# Patient Record
Sex: Male | Born: 1975
Health system: Southern US, Community
[De-identification: ages and names within clinical notes are randomized; demographics above are authoritative.]

## PROBLEM LIST (undated history)

## (undated) DIAGNOSIS — T7840XA Allergy, unspecified, initial encounter: Secondary | ICD-10-CM

## (undated) HISTORY — DX: Allergy, unspecified, initial encounter: T78.40XA

## (undated) HISTORY — PX: BRAIN SURGERY: SHX531

---

## 2015-01-15 ENCOUNTER — Ambulatory Visit
Admission: RE | Admit: 2015-01-15 | Discharge: 2015-01-15 | Disposition: A | Discharge status: Home / Self Care | Payer: BLUE CROSS/BLUE SHIELD | Source: Ambulatory Visit | Attending: Family Medicine | Admitting: Family Medicine

## 2015-01-15 ENCOUNTER — Ambulatory Visit: Payer: BLUE CROSS/BLUE SHIELD | Admitting: Family Medicine

## 2015-01-15 ENCOUNTER — Ambulatory Visit (INDEPENDENT_AMBULATORY_CARE_PROVIDER_SITE_OTHER): Payer: BLUE CROSS/BLUE SHIELD | Admitting: Family Medicine

## 2015-01-15 ENCOUNTER — Encounter: Payer: Self-pay | Admitting: Family Medicine

## 2015-01-15 VITALS — BP 120/70 | HR 89 | Temp 97.8°F | Resp 18 | Ht 72.0 in | Wt 190.0 lb

## 2015-01-15 DIAGNOSIS — J40 Bronchitis, not specified as acute or chronic: Secondary | ICD-10-CM | POA: Diagnosis not present

## 2015-01-15 MED ORDER — ALBUTEROL SULFATE HFA 108 (90 BASE) MCG/ACT IN AERS: 2.0000 | INHALATION_SPRAY | Freq: Four times a day (QID) | RESPIRATORY_TRACT | Status: DC | PRN

## 2015-01-15 MED ORDER — PREDNISONE 10 MG PO TABS: 10.0000 mg | ORAL_TABLET | Freq: Every day | ORAL | Status: DC

## 2015-01-15 MED ORDER — AZITHROMYCIN 250 MG PO TABS: ORAL_TABLET | ORAL | Status: DC
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
## 2015-01-16 ENCOUNTER — Telehealth: Payer: Self-pay | Admitting: Emergency Medicine

## 2015-01-16 NOTE — Telephone Encounter (Signed)
Patient notified

## 2015-01-18 ENCOUNTER — Ambulatory Visit: Payer: BLUE CROSS/BLUE SHIELD | Admitting: Family Medicine

## 2016-04-28 IMAGING — CR DG CHEST 2V
1 series · 2 of 2 positions shown · non-contrast
Comparison: None.

CLINICAL DATA: Cough.

EXAM:
CHEST  2 VIEW

[Series 1: dg chest 2 view · 0.14mm/px · 2 of 2 slices shown]
[im 1/2]
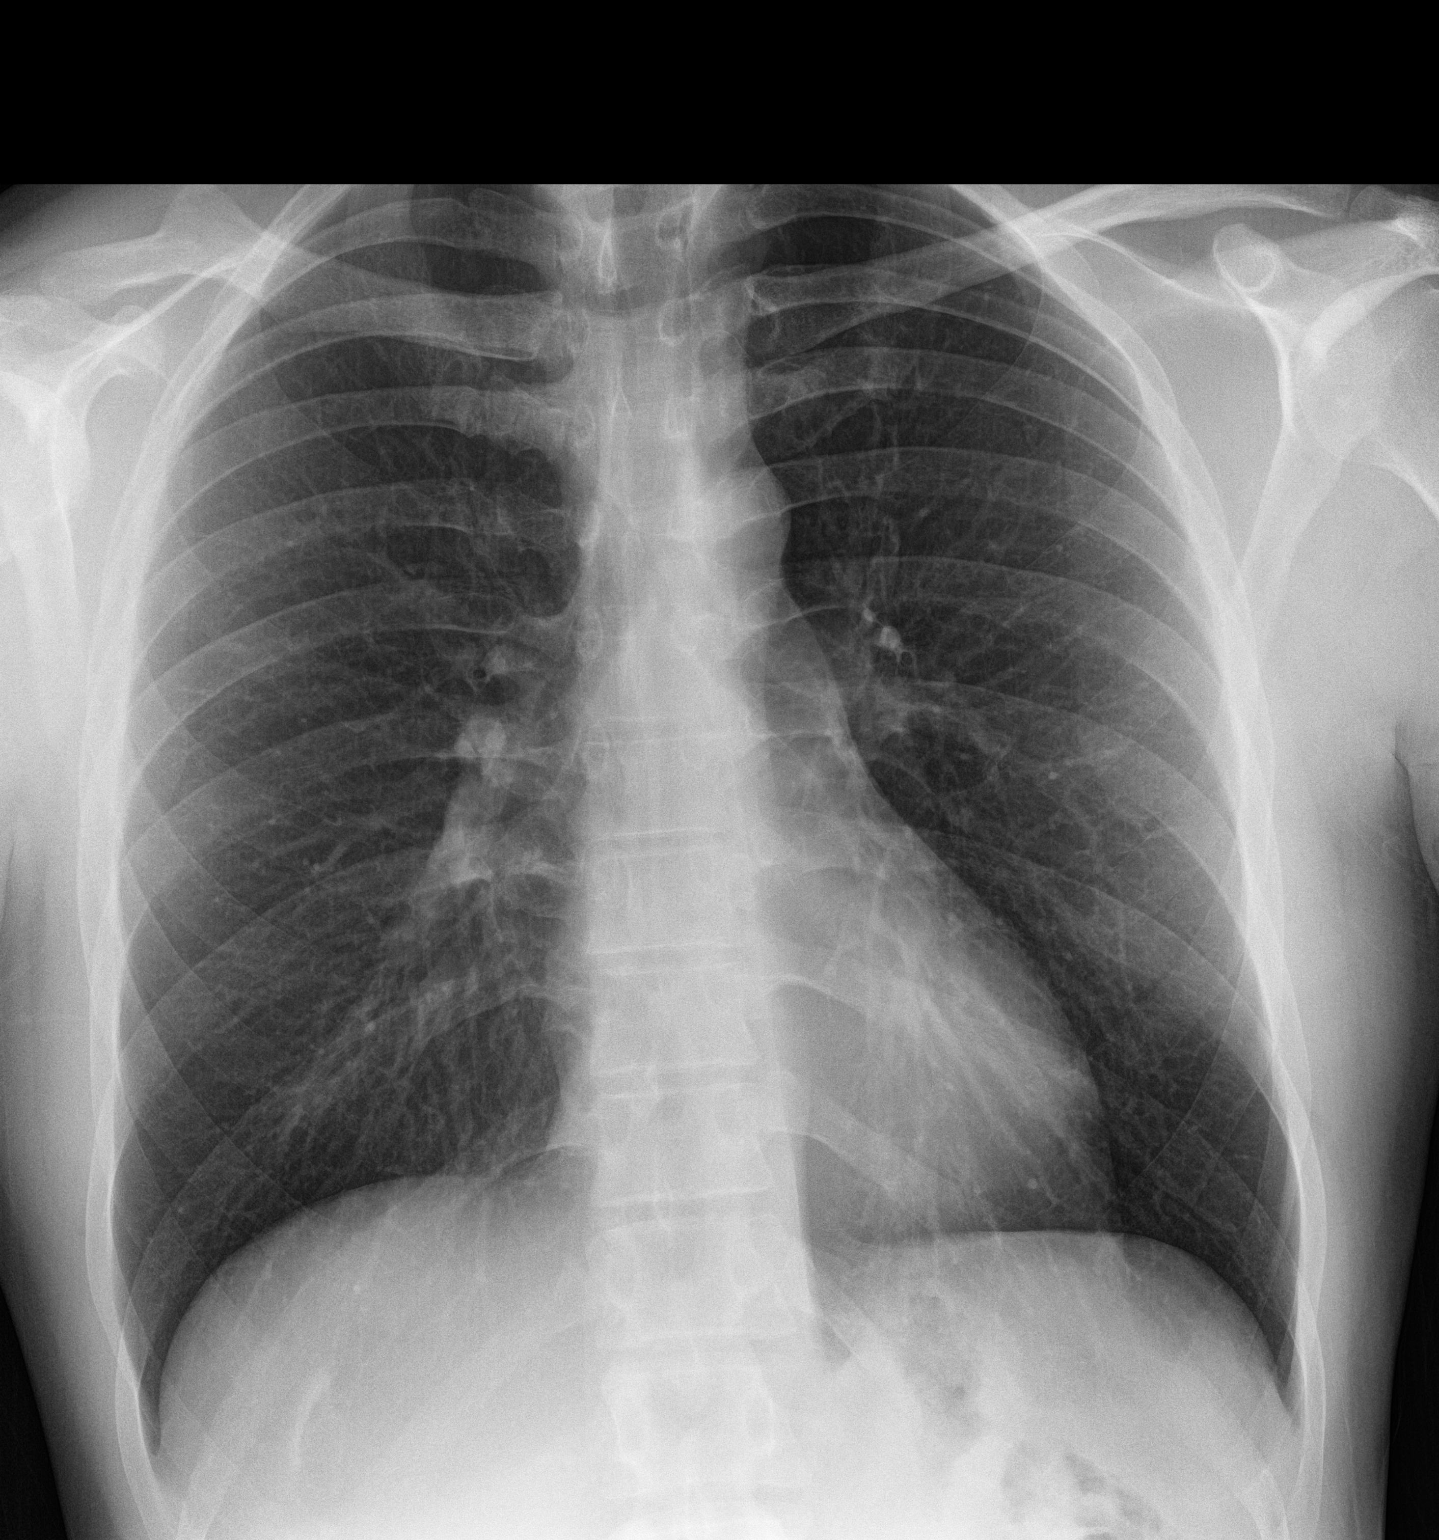
[im 2/2]
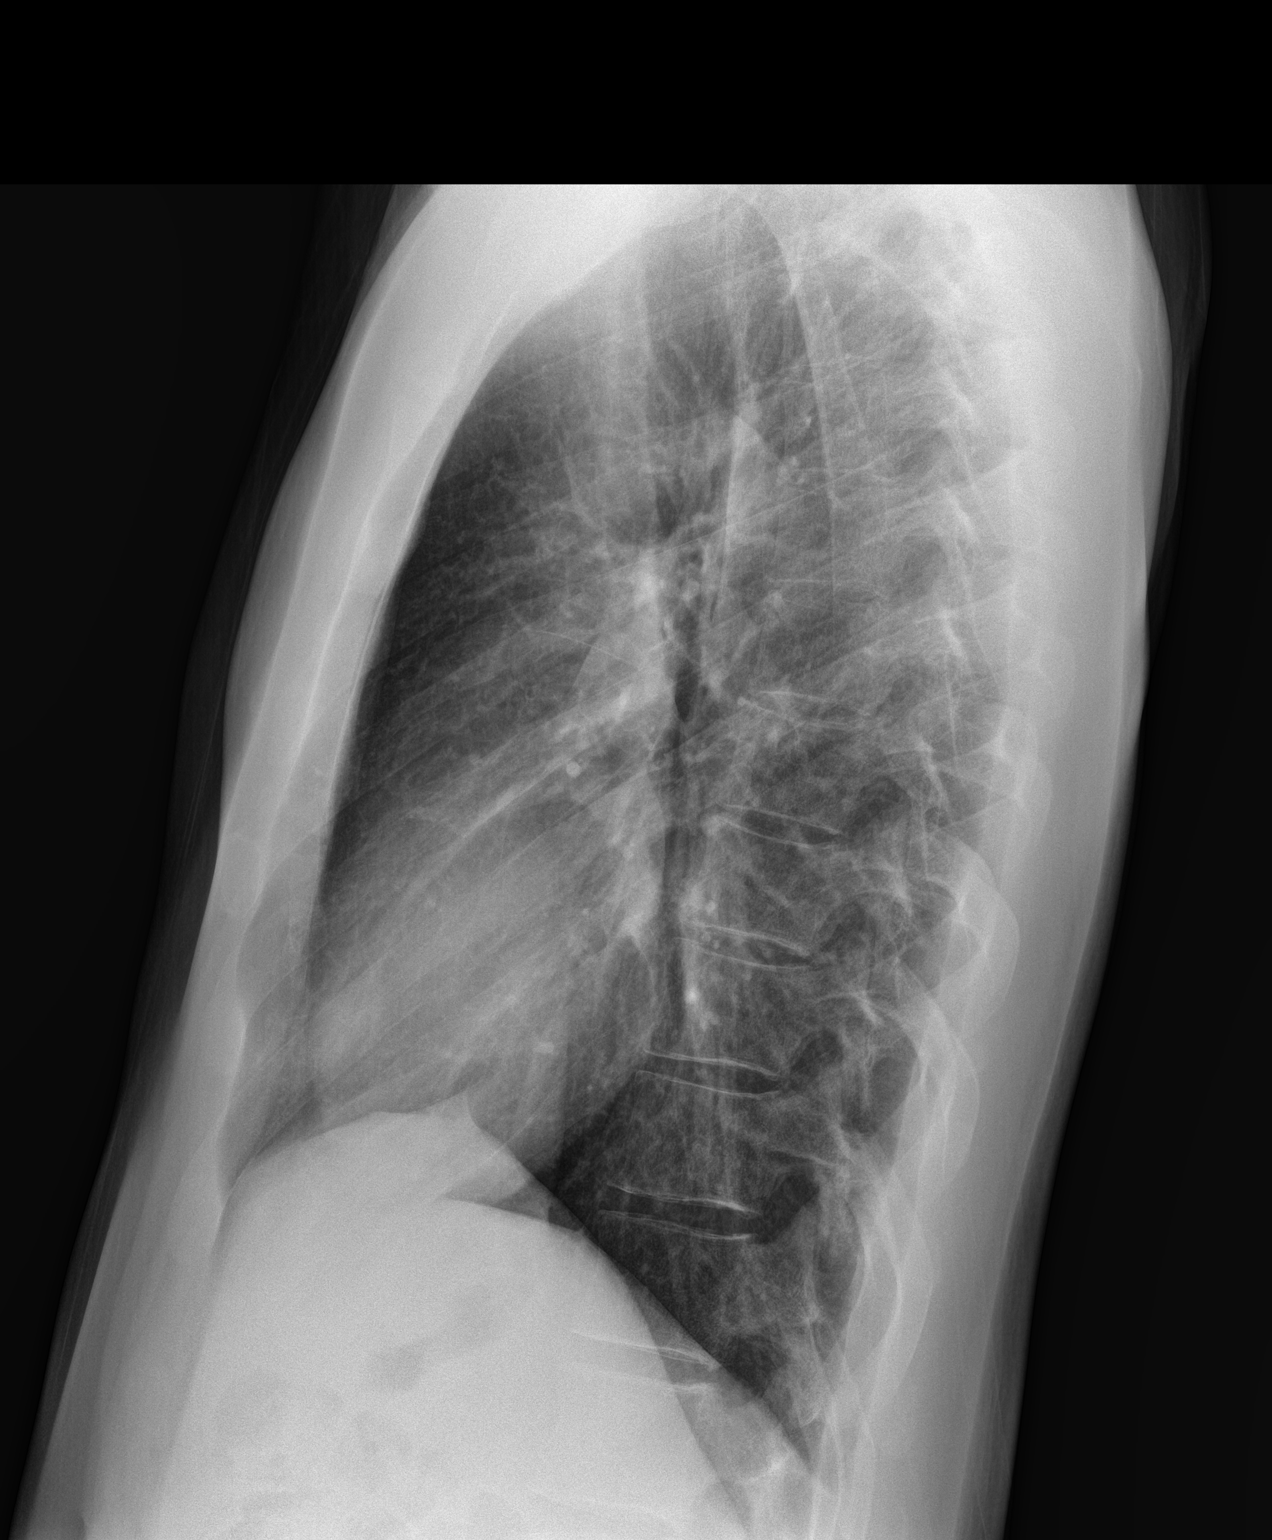

[2 of 2 positions shown; findings below may reference images not displayed]

FINDINGS: Mediastinum hilar structures normal the lungs are clear. Heart size
normal. No pleural effusion or pneumothorax. No acute bony
abnormality.
IMPRESSION: No acute cardiopulmonary disease.

## 2017-04-03 ENCOUNTER — Encounter: Payer: Self-pay | Admitting: Family Medicine

## 2017-04-03 ENCOUNTER — Ambulatory Visit (INDEPENDENT_AMBULATORY_CARE_PROVIDER_SITE_OTHER): Payer: Managed Care, Other (non HMO) | Admitting: Family Medicine

## 2017-04-03 VITALS — BP 110/70 | HR 80 | Temp 97.9°F | Resp 18 | Ht 72.0 in | Wt 181.3 lb

## 2017-04-03 DIAGNOSIS — M545 Low back pain, unspecified: Secondary | ICD-10-CM

## 2017-04-03 DIAGNOSIS — Z9889 Other specified postprocedural states: Secondary | ICD-10-CM | POA: Diagnosis not present

## 2017-04-03 DIAGNOSIS — R6889 Other general symptoms and signs: Secondary | ICD-10-CM

## 2017-04-03 DIAGNOSIS — Z87820 Personal history of traumatic brain injury: Secondary | ICD-10-CM | POA: Diagnosis not present

## 2017-04-03 DIAGNOSIS — S76319A Strain of muscle, fascia and tendon of the posterior muscle group at thigh level, unspecified thigh, initial encounter: Secondary | ICD-10-CM | POA: Diagnosis not present

## 2017-04-03 DIAGNOSIS — R0989 Other specified symptoms and signs involving the circulatory and respiratory systems: Secondary | ICD-10-CM | POA: Insufficient documentation

## 2017-04-03 MED ORDER — MELOXICAM 15 MG PO TABS: 7.5000 mg | ORAL_TABLET | Freq: Every day | ORAL | 0 refills | Status: DC

## 2017-04-03 MED ORDER — TIZANIDINE HCL 4 MG PO TABS: 2.0000 mg | ORAL_TABLET | Freq: Three times a day (TID) | ORAL | 0 refills | Status: DC | PRN

## 2017-04-03 NOTE — Patient Instructions (Addendum)
Rest and only do GENTLE stretching for about 2 weeks.  Gentle heat and massage on sore muscles.  Back Injury Prevention Back injuries can be very painful. They can also be difficult to heal. After having one back injury, you are more likely to injure your back again. It is important to learn how to avoid injuring or re-injuring your back. The following tips can help you to prevent a back injury. What should I know about physical fitness?  Exercise for 30 minutes per day on most days of the week or as told by your doctor. Make sure to: ? Do aerobic exercises, such as walking, jogging, biking, or swimming. ? Do exercises that increase balance and strength, such as tai chi and yoga. ? Do stretching exercises. This helps with flexibility. ? Try to develop strong belly (abdominal) muscles. Your belly muscles help to support your back.  Stay at a healthy weight. This helps to decrease your risk of a back injury. What should I know about my diet?  Talk with your doctor about your overall diet. Take supplements and vitamins only as told by your doctor.  Talk with your doctor about how much calcium and vitamin D you need each day. These nutrients help to prevent weakening of the bones (osteoporosis).  Include good sources of calcium in your diet, such as: ? Dairy products. ? Green leafy vegetables. ? Products that have had calcium added to them (fortified).  Include good sources of vitamin D in your diet, such as: ? Milk. ? Foods that have had vitamin D added to them. What should I know about my posture?  Sit up straight and stand up straight. Avoid leaning forward when you sit or hunching over when you stand.  Choose chairs that have good low-back (lumbar) support.  If you work at a desk, sit close to it so you do not need to lean over. Keep your chin tucked in. Keep your neck drawn back. Keep your elbows bent so your arms look like the letter "L" (right angle).  Sit high and close to the  steering wheel when you drive. Add a low-back support to your car seat, if needed.  Avoid sitting or standing in one position for very long. Take breaks to get up, stretch, and walk around at least one time every hour. Take breaks every hour if you are driving for long periods of time.  Sleep on your side with your knees slightly bent, or sleep on your back with a pillow under your knees. Do not lie on the front of your body to sleep. What should I know about lifting, twisting, and reaching? Lifting and Heavy Lifting   Avoid heavy lifting, especially lifting over and over again. If you must do heavy lifting: ? Stretch before lifting. ? Work slowly. ? Rest between lifts. ? Use a tool such as a cart or a dolly to move objects if one is available. ? Make several small trips instead of carrying one heavy load. ? Ask for help when you need it, especially when moving big objects.  Follow these steps when lifting: ? Stand with your feet shoulder-width apart. ? Get as close to the object as you can. Do not pick up a heavy object that is far from your body. ? Use handles or lifting straps if they are available. ? Bend at your knees. Squat down, but keep your heels off the floor. ? Keep your shoulders back. Keep your chin tucked in. Keep your back  straight. ? Lift the object slowly while you tighten the muscles in your legs, belly, and butt. Keep the object as close to the center of your body as possible.  Follow these steps when putting down a heavy load: ? Stand with your feet shoulder-width apart. ? Lower the object slowly while you tighten the muscles in your legs, belly, and butt. Keep the object as close to the center of your body as possible. ? Keep your shoulders back. Keep your chin tucked in. Keep your back straight. ? Bend at your knees. Squat down, but keep your heels off the floor. ? Use handles or lifting straps if they are available. Twisting and Reaching  Avoid lifting heavy  objects above your waist.  Do not twist at your waist while you are lifting or carrying a load. If you need to turn, move your feet.  Do not bend over without bending at your knees.  Avoid reaching over your head, across a table, or for an object on a high surface. What are some other tips?  Avoid wet floors and icy ground. Keep sidewalks clear of ice to prevent falls.  Do not sleep on a mattress that is too soft or too hard.  Keep items that you use often within easy reach.  Put heavier objects on shelves at waist level, and put lighter objects on lower or higher shelves.  Find ways to lower your stress, such as: ? Exercise. ? Massage. ? Relaxation techniques.  Talk with your doctor if you feel anxious or depressed. These conditions can make back pain worse.  Wear flat heel shoes with cushioned soles.  Avoid making quick (sudden) movements.  Use both shoulder straps when carrying a backpack.  Do not use any tobacco products, including cigarettes, chewing tobacco, or electronic cigarettes. If you need help quitting, ask your doctor. This information is not intended to replace advice given to you by your health care provider. Make sure you discuss any questions you have with your health care provider. Document Released: 08/20/2007 Document Revised: 08/09/2015 Document Reviewed: 03/07/2014 Elsevier Interactive Patient Education  2018 [employer].  Back Exercises If you have pain in your back, do these exercises 2-3 times each day or as told by your doctor. When the pain goes away, do the exercises once each day, but repeat the steps more times for each exercise (do more repetitions). If you do not have pain in your back, do these exercises once each day or as told by your doctor. Exercises Single Knee to Chest  Do these steps 3-5 times in a row for each leg: 1. Lie on your back on a firm bed or the floor with your legs stretched out. 2. Bring one knee to your chest. 3. Hold  your knee to your chest by grabbing your knee or thigh. 4. Pull on your knee until you feel a gentle stretch in your lower back. 5. Keep doing the stretch for 10-30 seconds. 6. Slowly let go of your leg and straighten it.  Pelvic Tilt  Do these steps 5-10 times in a row: 1. Lie on your back on a firm bed or the floor with your legs stretched out. 2. Bend your knees so they point up to the ceiling. Your feet should be flat on the floor. 3. Tighten your lower belly (abdomen) muscles to press your lower back against the floor. This will make your tailbone point up to the ceiling instead of pointing down to your feet or the  floor. 4. Stay in this position for 5-10 seconds while you gently tighten your muscles and breathe evenly.  Cat-Cow  Do these steps until your lower back bends more easily: 1. Get on your hands and knees on a firm surface. Keep your hands under your shoulders, and keep your knees under your hips. You may put padding under your knees. 2. Let your head hang down, and make your tailbone point down to the floor so your lower back is round like the back of a cat. 3. Stay in this position for 5 seconds. 4. Slowly lift your head and make your tailbone point up to the ceiling so your back hangs low (sags) like the back of a cow. 5. Stay in this position for 5 seconds.  Press-Ups  Do these steps 5-10 times in a row: 1. Lie on your belly (face-down) on the floor. 2. Place your hands near your head, about shoulder-width apart. 3. While you keep your back relaxed and keep your hips on the floor, slowly straighten your arms to raise the top half of your body and lift your shoulders. Do not use your back muscles. To make yourself more comfortable, you may change where you place your hands. 4. Stay in this position for 5 seconds. 5. Slowly return to lying flat on the floor.  Bridges  Do these steps 10 times in a row: 1. Lie on your back on a firm surface. 2. Bend your knees so  they point up to the ceiling. Your feet should be flat on the floor. 3. Tighten your butt muscles and lift your butt off of the floor until your waist is almost as high as your knees. If you do not feel the muscles working in your butt and the back of your thighs, slide your feet 1-2 inches farther away from your butt. 4. Stay in this position for 3-5 seconds. 5. Slowly lower your butt to the floor, and let your butt muscles relax.  If this exercise is too easy, try doing it with your arms crossed over your chest. Belly Crunches  Do these steps 5-10 times in a row: 1. Lie on your back on a firm bed or the floor with your legs stretched out. 2. Bend your knees so they point up to the ceiling. Your feet should be flat on the floor. 3. Cross your arms over your chest. 4. Tip your chin a little bit toward your chest but do not bend your neck. 5. Tighten your belly muscles and slowly raise your chest just enough to lift your shoulder blades a tiny bit off of the floor. 6. Slowly lower your chest and your head to the floor.  Back Lifts Do these steps 5-10 times in a row: 1. Lie on your belly (face-down) with your arms at your sides, and rest your forehead on the floor. 2. Tighten the muscles in your legs and your butt. 3. Slowly lift your chest off of the floor while you keep your hips on the floor. Keep the back of your head in line with the curve in your back. Look at the floor while you do this. 4. Stay in this position for 3-5 seconds. 5. Slowly lower your chest and your face to the floor.  Contact a doctor if:  Your back pain gets a lot worse when you do an exercise.  Your back pain does not lessen 2 hours after you exercise. If you have any of these problems, stop doing the exercises.  Do not do them again unless your doctor says it is okay. Get help right away if:  You have sudden, very bad back pain. If this happens, stop doing the exercises. Do not do them again unless your doctor  says it is okay. This information is not intended to replace advice given to you by your health care provider. Make sure you discuss any questions you have with your health care provider. Document Released: 04/05/2010 Document Revised: 08/09/2015 Document Reviewed: 04/27/2014 Elsevier Interactive Patient Education  [employer].
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
## 2017-04-13 ENCOUNTER — Encounter: Payer: Managed Care, Other (non HMO) | Admitting: Family Medicine

## 2017-04-13 ENCOUNTER — Encounter: Payer: Self-pay | Admitting: Family Medicine

## 2017-04-13 ENCOUNTER — Ambulatory Visit (INDEPENDENT_AMBULATORY_CARE_PROVIDER_SITE_OTHER): Payer: Managed Care, Other (non HMO) | Admitting: Family Medicine

## 2017-04-13 VITALS — BP 120/82 | HR 79 | Temp 98.3°F | Resp 18 | Ht 72.0 in | Wt 181.7 lb

## 2017-04-13 DIAGNOSIS — Z114 Encounter for screening for human immunodeficiency virus [HIV]: Secondary | ICD-10-CM

## 2017-04-13 DIAGNOSIS — Z13220 Encounter for screening for lipoid disorders: Secondary | ICD-10-CM | POA: Diagnosis not present

## 2017-04-13 DIAGNOSIS — Z789 Other specified health status: Secondary | ICD-10-CM

## 2017-04-13 DIAGNOSIS — Z131 Encounter for screening for diabetes mellitus: Secondary | ICD-10-CM

## 2017-04-13 DIAGNOSIS — Z0000 Encounter for general adult medical examination without abnormal findings: Secondary | ICD-10-CM

## 2017-04-13 DIAGNOSIS — Z7722 Contact with and (suspected) exposure to environmental tobacco smoke (acute) (chronic): Secondary | ICD-10-CM

## 2017-04-13 DIAGNOSIS — Z833 Family history of diabetes mellitus: Secondary | ICD-10-CM | POA: Diagnosis not present

## 2017-04-13 NOTE — Patient Instructions (Signed)
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
## 2017-04-20 ENCOUNTER — Ambulatory Visit: Payer: Managed Care, Other (non HMO) | Admitting: Family Medicine

## 2017-05-14 ENCOUNTER — Telehealth: Payer: Self-pay | Admitting: Family Medicine

## 2017-05-14 NOTE — Telephone Encounter (Signed)
Please call pt and remind him to come in for fasting labs.

## 2017-05-18 NOTE — Telephone Encounter (Signed)
Left message for patient to come by for labs 

## 2017-06-01 ENCOUNTER — Encounter: Payer: Self-pay | Admitting: Family Medicine

## 2017-06-01 ENCOUNTER — Ambulatory Visit (INDEPENDENT_AMBULATORY_CARE_PROVIDER_SITE_OTHER): Payer: Managed Care, Other (non HMO) | Admitting: Family Medicine

## 2017-06-01 NOTE — Progress Notes (Signed)
Not seen , he had CPE with Maurice SmallEmily Boyce 03/2017

## 2017-06-02 LAB — HIV ANTIBODY (ROUTINE TESTING W REFLEX): HIV: NONREACTIVE

## 2017-06-02 LAB — COMPLETE METABOLIC PANEL WITH GFR
AG Ratio: 1.8 (calc) (ref 1.0–2.5)
ALKALINE PHOSPHATASE (APISO): 73 U/L (ref 40–115)
ALT: 18 U/L (ref 9–46)
AST: 19 U/L (ref 10–40)
Albumin: 4.8 g/dL (ref 3.6–5.1)
BILIRUBIN TOTAL: 0.6 mg/dL (ref 0.2–1.2)
BUN: 22 mg/dL (ref 7–25)
CHLORIDE: 105 mmol/L (ref 98–110)
CO2: 30 mmol/L (ref 20–32)
Calcium: 10 mg/dL (ref 8.6–10.3)
Creat: 1.22 mg/dL (ref 0.60–1.35)
GFR, EST AFRICAN AMERICAN: 84 mL/min/{1.73_m2} (ref >=60)
GFR, Est Non African American: 73 mL/min/{1.73_m2} (ref >=60)
GLUCOSE: 80 mg/dL (ref 65–139)
Globulin: 2.7 g/dL (calc) (ref 1.9–3.7)
Potassium: 4.1 mmol/L (ref 3.5–5.3)
Sodium: 143 mmol/L (ref 135–146)
TOTAL PROTEIN: 7.5 g/dL (ref 6.1–8.1)

## 2017-06-02 LAB — LIPID PANEL
CHOLESTEROL: 176 mg/dL (ref <200)
HDL: 62 mg/dL (ref >40)
LDL CHOLESTEROL (CALC): 77 mg/dL
Non-HDL Cholesterol (Calc): 114 mg/dL (calc) (ref <130)
TRIGLYCERIDES: 284 mg/dL — AB (ref <150)
Total CHOL/HDL Ratio: 2.8 (calc) (ref <5.0)

## 2019-01-14 ENCOUNTER — Ambulatory Visit (INDEPENDENT_AMBULATORY_CARE_PROVIDER_SITE_OTHER): Payer: BC Managed Care – PPO | Admitting: Family Medicine

## 2019-01-14 ENCOUNTER — Other Ambulatory Visit: Payer: Self-pay

## 2019-01-14 ENCOUNTER — Encounter: Payer: Self-pay | Admitting: Family Medicine

## 2019-01-14 VITALS — BP 110/80 | HR 88 | Temp 96.9°F | Resp 16 | Ht 74.0 in | Wt 200.0 lb

## 2019-01-14 DIAGNOSIS — Z833 Family history of diabetes mellitus: Secondary | ICD-10-CM | POA: Diagnosis not present

## 2019-01-14 DIAGNOSIS — R351 Nocturia: Secondary | ICD-10-CM

## 2019-01-14 DIAGNOSIS — R350 Frequency of micturition: Secondary | ICD-10-CM | POA: Diagnosis not present

## 2019-01-14 LAB — POCT URINALYSIS DIPSTICK
Appearance: ABNORMAL
Bilirubin, UA: NEGATIVE
Blood, UA: NEGATIVE
Glucose, UA: NEGATIVE
Ketones, UA: NEGATIVE
Leukocytes, UA: NEGATIVE
Nitrite, UA: NEGATIVE
Odor: NORMAL
Protein, UA: NEGATIVE
Spec Grav, UA: 1.025 (ref 1.010–1.025)
Urobilinogen, UA: 0.2 E.U./dL
pH, UA: 5 (ref 5.0–8.0)

## 2019-01-14 MED ORDER — CIPROFLOXACIN HCL 250 MG PO TABS: 250.0000 mg | ORAL_TABLET | Freq: Two times a day (BID) | ORAL | 0 refills | Status: DC
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
## 2019-01-17 LAB — CBC WITH DIFFERENTIAL/PLATELET
Absolute Monocytes: 414 cells/uL (ref 200–950)
Basophils Absolute: 29 cells/uL (ref 0–200)
Basophils Relative: 0.7 %
Eosinophils Absolute: 123 cells/uL (ref 15–500)
Eosinophils Relative: 3 %
HCT: 49.1 % (ref 38.5–50.0)
Hemoglobin: 16.4 g/dL (ref 13.2–17.1)
Lymphs Abs: 1423 cells/uL (ref 850–3900)
MCH: 30.4 pg (ref 27.0–33.0)
MCHC: 33.4 g/dL (ref 32.0–36.0)
MCV: 90.9 fL (ref 80.0–100.0)
MPV: 10.2 fL (ref 7.5–12.5)
Monocytes Relative: 10.1 %
Neutro Abs: 2112 cells/uL (ref 1500–7800)
Neutrophils Relative %: 51.5 %
Platelets: 225 10*3/uL (ref 140–400)
RBC: 5.4 10*6/uL (ref 4.20–5.80)
RDW: 12.3 % (ref 11.0–15.0)
Total Lymphocyte: 34.7 %
WBC: 4.1 10*3/uL (ref 3.8–10.8)

## 2019-01-17 LAB — COMPLETE METABOLIC PANEL WITH GFR
AG Ratio: 2 (calc) (ref 1.0–2.5)
ALT: 18 U/L (ref 9–46)
AST: 16 U/L (ref 10–40)
Albumin: 4.7 g/dL (ref 3.6–5.1)
Alkaline phosphatase (APISO): 65 U/L (ref 36–130)
BUN: 16 mg/dL (ref 7–25)
CO2: 29 mmol/L (ref 20–32)
Calcium: 9.9 mg/dL (ref 8.6–10.3)
Chloride: 104 mmol/L (ref 98–110)
Creat: 0.93 mg/dL (ref 0.60–1.35)
GFR, Est African American: 116 mL/min/{1.73_m2} (ref >=60)
GFR, Est Non African American: 100 mL/min/{1.73_m2} (ref >=60)
Globulin: 2.4 g/dL (calc) (ref 1.9–3.7)
Glucose, Bld: 100 mg/dL — ABNORMAL HIGH (ref 65–99)
Potassium: 5.7 mmol/L — ABNORMAL HIGH (ref 3.5–5.3)
Sodium: 138 mmol/L (ref 135–146)
Total Bilirubin: 0.6 mg/dL (ref 0.2–1.2)
Total Protein: 7.1 g/dL (ref 6.1–8.1)

## 2019-01-17 LAB — HEMOGLOBIN A1C
Hgb A1c MFr Bld: 5.2 % of total Hgb (ref <5.7)
Mean Plasma Glucose: 103 (calc)
eAG (mmol/L): 5.7 (calc)

## 2019-01-17 LAB — CULTURE, URINE COMPREHENSIVE
MICRO NUMBER:: 1051660
RESULT:: NO GROWTH
SPECIMEN QUALITY:: ADEQUATE

## 2022-08-04 ENCOUNTER — Encounter: Payer: Self-pay | Admitting: Emergency Medicine

## 2022-08-04 ENCOUNTER — Ambulatory Visit
Admission: EM | Admit: 2022-08-04 | Discharge: 2022-08-04 | Disposition: A | Discharge status: Home / Self Care | Payer: BC Managed Care – PPO | Attending: Urgent Care | Admitting: Urgent Care

## 2022-08-04 DIAGNOSIS — L089 Local infection of the skin and subcutaneous tissue, unspecified: Secondary | ICD-10-CM

## 2022-08-04 DIAGNOSIS — T148XXA Other injury of unspecified body region, initial encounter: Secondary | ICD-10-CM | POA: Diagnosis not present

## 2022-08-04 DIAGNOSIS — W57XXXA Bitten or stung by nonvenomous insect and other nonvenomous arthropods, initial encounter: Secondary | ICD-10-CM | POA: Diagnosis not present

## 2022-08-04 MED ORDER — DOXYCYCLINE HYCLATE 100 MG PO CAPS: 100.0000 mg | ORAL_CAPSULE | Freq: Two times a day (BID) | ORAL | 0 refills | Status: AC

## 2022-08-04 NOTE — Discharge Instructions (Addendum)
Follow up here or with your primary care provider if your symptoms are worsening or not improving.     
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
## 2022-08-04 NOTE — ED Triage Notes (Signed)
Pt reports multiple tick bites all over on Saturday/ Sunday. Has a bite on the upper right arm, right side of the abdomen and groin area. Denies any fatigue, fever or body aches.

## 2024-03-14 ENCOUNTER — Other Ambulatory Visit: Payer: Self-pay

## 2024-03-14 MED ORDER — FLUZONE 0.5 ML IM SUSY
0.5000 mL | PREFILLED_SYRINGE | Freq: Once | INTRAMUSCULAR | 0 refills | Status: AC
  Filled 2024-03-14: qty 0.5, 1d supply, fill #0

## 2024-04-05 ENCOUNTER — Ambulatory Visit: Admitting: Nurse Practitioner

## 2024-04-05 ENCOUNTER — Encounter: Payer: Self-pay | Admitting: Nurse Practitioner

## 2024-04-05 VITALS — BP 106/80 | HR 91 | Temp 97.6°F | Ht 74.0 in | Wt 191.0 lb

## 2024-04-05 DIAGNOSIS — Z130 Encounter for screening for diseases of the blood and blood-forming organs and certain disorders involving the immune mechanism: Secondary | ICD-10-CM

## 2024-04-05 DIAGNOSIS — E781 Pure hyperglyceridemia: Secondary | ICD-10-CM | POA: Diagnosis not present

## 2024-04-05 DIAGNOSIS — Z125 Encounter for screening for malignant neoplasm of prostate: Secondary | ICD-10-CM

## 2024-04-05 DIAGNOSIS — Z131 Encounter for screening for diabetes mellitus: Secondary | ICD-10-CM

## 2024-04-05 DIAGNOSIS — Z13220 Encounter for screening for lipoid disorders: Secondary | ICD-10-CM

## 2024-04-05 DIAGNOSIS — Z1159 Encounter for screening for other viral diseases: Secondary | ICD-10-CM

## 2024-04-05 DIAGNOSIS — Z3009 Encounter for other general counseling and advice on contraception: Secondary | ICD-10-CM | POA: Diagnosis not present

## 2024-04-05 DIAGNOSIS — Z7689 Persons encountering health services in other specified circumstances: Secondary | ICD-10-CM

## 2024-04-05 NOTE — Progress Notes (Signed)
 "  BP 106/80   Pulse 91   Temp 97.6 F (36.4 C)   Ht 6' 2 (1.88 m)   Wt 191 lb (86.6 kg)   SpO2 99%   BMI 24.52 kg/m    Subjective:    Patient ID: George Reed, male    DOB: 18-May-1975, 49 y.o.   MRN: [id number]  HPI: George Reed is a 49 y.o. male  Chief Complaint  Patient presents with   Establish Care    Pt would like vasectomy.    Discussed the use of AI scribe software for clinical note transcription with the patient, who gave verbal consent to proceed.  History of Present Illness George Reed is a 49 year old male who presents to re-establish care and for a referral for a vasectomy.  Request for vasectomy referral - Seeking referral for vasectomy. - Concerned about timing due to potential job loss and loss of insurance coverage at the end of the month.  Psychological stress - Elevated stress levels related to possibility of being fired from his job. - Despite stress, overall sense of improved well-being.  Cardiac symptoms - History of occasional heart palpitations, resolved. - No heart palpitations in the past three years.  hyperlipidemia - Previously diagnosed with elevated triglycerides - Feels healthier since last visit in 2019.         04/05/2024    8:40 AM 01/14/2019    8:03 AM 06/01/2017    2:21 PM  Depression screen PHQ 2/9  Decreased Interest 0 0 0  Down, Depressed, Hopeless 0 0 0  PHQ - 2 Score 0 0 0  Altered sleeping  0   Tired, decreased energy  0   Change in appetite  0   Feeling bad or failure about yourself   0   Trouble concentrating  0   Moving slowly or fidgety/restless  0   Suicidal thoughts  0   PHQ-9 Score  0       Data saved with a previous flowsheet row definition    Relevant past medical, surgical, family and social history reviewed and updated as indicated. Interim medical history since our last visit reviewed. Allergies and medications reviewed and updated.  Review of Systems  Constitutional:  Negative for fever or weight change.  Respiratory: Negative for cough and shortness of breath.   Cardiovascular: Negative for chest pain or palpitations.  Gastrointestinal: Negative for abdominal pain, no bowel changes.  Musculoskeletal: Negative for gait problem or joint swelling.  Skin: Negative for rash.  Neurological: Negative for dizziness or headache.  No other specific complaints in a complete review of systems (except as listed in HPI above).      Objective:      BP 106/80   Pulse 91   Temp 97.6 F (36.4 C)   Ht 6' 2 (1.88 m)   Wt 191 lb (86.6 kg)   SpO2 99%   BMI 24.52 kg/m    Wt Readings from Last 3 Encounters:  04/05/24 191 lb (86.6 kg)  01/14/19 200 lb (90.7 kg)  06/01/17 177 lb 11.2 oz (80.6 kg)    Physical Exam VITALS: BP- 106/80 MEASUREMENTS: Weight- 191, BMI- 24.52. GENERAL: Alert, cooperative, well developed, no acute distress HEENT: Normocephalic, normal oropharynx, moist mucous membranes CHEST: Clear to auscultation bilaterally, no wheezes, rhonchi, or crackles CARDIOVASCULAR: Normal heart rate and rhythm, S1 and S2 normal without murmurs ABDOMEN: Soft, non-tender, non-distended, without organomegaly, normal bowel sounds EXTREMITIES: No cyanosis or edema NEUROLOGICAL: Cranial nerves grossly intact,  moves all extremities without gross motor or sensory deficit  Results for orders placed or performed in visit on 01/14/19  CULTURE, URINE COMPREHENSIVE   Collection Time: 01/14/19 12:00 AM  Result Value Ref Range   MICRO NUMBER: [id number]    SPECIMEN QUALITY: Adequate    Source OTHER (SPECIFY)    STATUS: FINAL    RESULT: No Growth   COMPLETE METABOLIC PANEL WITH GFR   Collection Time: 01/14/19 12:00 AM  Result Value Ref Range   Glucose, Bld 100 (H) 65 - 99 mg/dL   BUN 16 7 - 25 mg/dL   Creat 9.06 9.39 - 8.64 mg/dL   GFR, Est Non African American 100 > OR = 60 mL/min/1.29m2   GFR, Est African American 116 > OR = 60 mL/min/1.47m2   BUN/Creatinine  Ratio NOT APPLICABLE 6 - 22 (calc)   Sodium 138 135 - 146 mmol/L   Potassium 5.7 (H) 3.5 - 5.3 mmol/L   Chloride 104 98 - 110 mmol/L   CO2 29 20 - 32 mmol/L   Calcium 9.9 8.6 - 10.3 mg/dL   Total Protein 7.1 6.1 - 8.1 g/dL   Albumin 4.7 3.6 - 5.1 g/dL   Globulin 2.4 1.9 - 3.7 g/dL (calc)   AG Ratio 2.0 1.0 - 2.5 (calc)   Total Bilirubin 0.6 0.2 - 1.2 mg/dL   Alkaline phosphatase (APISO) 65 36 - 130 U/L   AST 16 10 - 40 U/L   ALT 18 9 - 46 U/L  CBC with Differential/Platelet   Collection Time: 01/14/19 12:00 AM  Result Value Ref Range   WBC 4.1 3.8 - 10.8 Thousand/uL   RBC 5.40 4.20 - 5.80 Million/uL   Hemoglobin 16.4 13.2 - 17.1 g/dL   HCT 50.8 61.4 - 49.9 %   MCV 90.9 80.0 - 100.0 fL   MCH 30.4 27.0 - 33.0 pg   MCHC 33.4 32.0 - 36.0 g/dL   RDW 87.6 88.9 - 84.9 %   Platelets 225 140 - 400 Thousand/uL   MPV 10.2 7.5 - 12.5 fL   Neutro Abs 2,112 1,500 - 7,800 cells/uL   Lymphs Abs 1,423 850 - 3,900 cells/uL   Absolute Monocytes 414 200 - 950 cells/uL   Eosinophils Absolute 123 15 - 500 cells/uL   Basophils Absolute 29 0 - 200 cells/uL   Neutrophils Relative % 51.5 %   Total Lymphocyte 34.7 %   Monocytes Relative 10.1 %   Eosinophils Relative 3.0 %   Basophils Relative 0.7 %  Hemoglobin A1c   Collection Time: 01/14/19 12:00 AM  Result Value Ref Range   Hgb A1c MFr Bld 5.2 <5.7 % of total Hgb   Mean Plasma Glucose 103 (calc)   eAG (mmol/L) 5.7 (calc)  POCT Urinalysis Dipstick   Collection Time: 01/14/19  8:48 AM  Result Value Ref Range   Color, UA Orange    Clarity, UA Cloudy    Glucose, UA Negative Negative   Bilirubin, UA Negative    Ketones, UA Negative    Spec Grav, UA 1.025 1.010 - 1.025   Blood, UA Negative    pH, UA 5.0 5.0 - 8.0   Protein, UA Negative Negative   Urobilinogen, UA 0.2 0.2 or 1.0 E.U./dL   Nitrite, UA Negative    Leukocytes, UA Negative Negative   Appearance Abnormal    Odor Normal           Assessment & Plan:   Problem List Items  Addressed This Visit  Other   High triglycerides   Relevant Orders   Lipid panel   Other Visit Diagnoses       Vasectomy evaluation    -  Primary   Relevant Orders   Ambulatory referral to Urology     Encounter to establish care         Birth control counseling         Screening for deficiency anemia       Relevant Orders   CBC with Differential/Platelet     Screening for cholesterol level       Relevant Orders   Lipid panel     Screening for diabetes mellitus       Relevant Orders   Comprehensive metabolic panel with GFR   Hemoglobin A1c     Encounter for hepatitis C screening test for low risk patient       Relevant Orders   Hepatitis C antibody     Screening for prostate cancer       Relevant Orders   PSA        Assessment and Plan Assessment & Plan Vasectomy evaluation and referral Desires vasectomy. Concern about insurance coverage due to potential job loss. - Placed referral to urology for vasectomy evaluation - Provided urology contact number for scheduling  Establishing care Re-establishing care after last visit in 2019. Reports improved health and cessation of heart palpitations over the past three years. - Performed physical examination - Ordered routine lab work including A1c  Screening for dyslipidemia and diabetes mellitus Last lipid panel in 2019 showed total cholesterol of 284 mg/dL and LDL of 77 mg/dL. Interested in assessing current status of dyslipidemia and diabetes mellitus. - Ordered lipid panel - Ordered A1c test  Pure hyperglyceridemia No specific discussion of current triglyceride levels or management in this encounter.        Follow up plan: Return in about 1 year (around 04/05/2025) for cpe. "

## 2024-04-06 ENCOUNTER — Ambulatory Visit: Payer: Self-pay | Admitting: Nurse Practitioner

## 2024-04-06 LAB — CBC WITH DIFFERENTIAL/PLATELET
Absolute Lymphocytes: 1602 {cells}/uL (ref 850–3900)
Absolute Monocytes: 392 {cells}/uL (ref 200–950)
Basophils Absolute: 40 {cells}/uL (ref 0–200)
Basophils Relative: 0.9 %
Eosinophils Absolute: 40 {cells}/uL (ref 15–500)
Eosinophils Relative: 0.9 %
HCT: 54.3 % — ABNORMAL HIGH (ref 39.4–51.1)
Hemoglobin: 18 g/dL — ABNORMAL HIGH (ref 13.2–17.1)
MCH: 30.6 pg (ref 27.0–33.0)
MCHC: 33.1 g/dL (ref 31.6–35.4)
MCV: 92.2 fL (ref 81.4–101.7)
MPV: 10.1 fL (ref 7.5–12.5)
Monocytes Relative: 8.9 %
Neutro Abs: 2328 {cells}/uL (ref 1500–7800)
Neutrophils Relative %: 52.9 %
Platelets: 258 Thousand/uL (ref 140–400)
RBC: 5.89 Million/uL — ABNORMAL HIGH (ref 4.20–5.80)
RDW: 12.8 % (ref 11.0–15.0)
Total Lymphocyte: 36.4 %
WBC: 4.4 Thousand/uL (ref 3.8–10.8)

## 2024-04-06 LAB — COMPREHENSIVE METABOLIC PANEL WITH GFR
AG Ratio: 1.9 (calc) (ref 1.0–2.5)
ALT: 18 U/L (ref 9–46)
AST: 21 U/L (ref 10–40)
Albumin: 5.3 g/dL — ABNORMAL HIGH (ref 3.6–5.1)
Alkaline phosphatase (APISO): 82 U/L (ref 36–130)
BUN: 17 mg/dL (ref 7–25)
CO2: 28 mmol/L (ref 20–32)
Calcium: 10.2 mg/dL (ref 8.6–10.3)
Chloride: 99 mmol/L (ref 98–110)
Creat: 1 mg/dL (ref 0.60–1.29)
Globulin: 2.8 g/dL (ref 1.9–3.7)
Glucose, Bld: 100 mg/dL — ABNORMAL HIGH (ref 65–99)
Potassium: 5 mmol/L (ref 3.5–5.3)
Sodium: 136 mmol/L (ref 135–146)
Total Bilirubin: 1.2 mg/dL (ref 0.2–1.2)
Total Protein: 8.1 g/dL (ref 6.1–8.1)
eGFR: 93 mL/min/1.73m2

## 2024-04-06 LAB — HEPATITIS C ANTIBODY: Hepatitis C Ab: NONREACTIVE

## 2024-04-06 LAB — HEMOGLOBIN A1C
Hgb A1c MFr Bld: 5.2 %
Mean Plasma Glucose: 103 mg/dL
eAG (mmol/L): 5.7 mmol/L

## 2024-04-06 LAB — LIPID PANEL
Cholesterol: 232 mg/dL — ABNORMAL HIGH
HDL: 78 mg/dL
LDL Cholesterol (Calc): 135 mg/dL — ABNORMAL HIGH
Non-HDL Cholesterol (Calc): 154 mg/dL — ABNORMAL HIGH
Total CHOL/HDL Ratio: 3 (calc)
Triglycerides: 88 mg/dL

## 2024-04-06 LAB — PSA: PSA: 0.91 ng/mL

## 2024-04-18 ENCOUNTER — Ambulatory Visit: Payer: Self-pay | Admitting: Urology

## 2024-05-05 ENCOUNTER — Ambulatory Visit: Payer: Self-pay | Admitting: Urology

## 2025-04-07 ENCOUNTER — Encounter: Admitting: Nurse Practitioner
